# Patient Record
Sex: Female | Born: 1935 | Race: White | Hispanic: No
Health system: Southern US, Community
[De-identification: ages and names within clinical notes are randomized; demographics above are authoritative.]

## PROBLEM LIST (undated history)

## (undated) ENCOUNTER — Emergency Department (HOSPITAL_COMMUNITY): Payer: Self-pay

## (undated) DIAGNOSIS — F0390 Unspecified dementia without behavioral disturbance: Secondary | ICD-10-CM

## (undated) DIAGNOSIS — J9621 Acute and chronic respiratory failure with hypoxia: Secondary | ICD-10-CM

## (undated) DIAGNOSIS — J189 Pneumonia, unspecified organism: Secondary | ICD-10-CM

## (undated) DIAGNOSIS — I5032 Chronic diastolic (congestive) heart failure: Secondary | ICD-10-CM

## (undated) HISTORY — DX: Pneumonia, unspecified organism: J18.9

## (undated) HISTORY — DX: Chronic diastolic (congestive) heart failure: I50.32

## (undated) HISTORY — DX: Unspecified dementia, unspecified severity, without behavioral disturbance, psychotic disturbance, mood disturbance, and anxiety: F03.90

## (undated) HISTORY — DX: Acute and chronic respiratory failure with hypoxia: J96.21

---

## 2020-02-13 ENCOUNTER — Other Ambulatory Visit (HOSPITAL_COMMUNITY): Payer: Self-pay

## 2020-02-13 ENCOUNTER — Inpatient Hospital Stay
Admission: EM | Admit: 2020-02-13 | Discharge: 2020-03-09 | Disposition: E | Payer: Medicare Other | Attending: Internal Medicine | Admitting: Internal Medicine

## 2020-02-13 DIAGNOSIS — I5032 Chronic diastolic (congestive) heart failure: Secondary | ICD-10-CM | POA: Diagnosis present

## 2020-02-13 DIAGNOSIS — K567 Ileus, unspecified: Secondary | ICD-10-CM

## 2020-02-13 DIAGNOSIS — R0603 Acute respiratory distress: Secondary | ICD-10-CM

## 2020-02-13 DIAGNOSIS — J189 Pneumonia, unspecified organism: Secondary | ICD-10-CM | POA: Diagnosis present

## 2020-02-13 DIAGNOSIS — Z0189 Encounter for other specified special examinations: Secondary | ICD-10-CM

## 2020-02-13 DIAGNOSIS — N179 Acute kidney failure, unspecified: Secondary | ICD-10-CM

## 2020-02-13 DIAGNOSIS — F0390 Unspecified dementia without behavioral disturbance: Secondary | ICD-10-CM | POA: Diagnosis present

## 2020-02-13 DIAGNOSIS — J9621 Acute and chronic respiratory failure with hypoxia: Secondary | ICD-10-CM | POA: Diagnosis present

## 2020-02-13 DIAGNOSIS — J90 Pleural effusion, not elsewhere classified: Secondary | ICD-10-CM

## 2020-02-13 DIAGNOSIS — Z4659 Encounter for fitting and adjustment of other gastrointestinal appliance and device: Secondary | ICD-10-CM

## 2020-02-14 DIAGNOSIS — F0390 Unspecified dementia without behavioral disturbance: Secondary | ICD-10-CM | POA: Diagnosis not present

## 2020-02-14 DIAGNOSIS — J9621 Acute and chronic respiratory failure with hypoxia: Secondary | ICD-10-CM | POA: Diagnosis not present

## 2020-02-14 DIAGNOSIS — N179 Acute kidney failure, unspecified: Secondary | ICD-10-CM | POA: Diagnosis not present

## 2020-02-14 DIAGNOSIS — J189 Pneumonia, unspecified organism: Secondary | ICD-10-CM

## 2020-02-14 DIAGNOSIS — I5032 Chronic diastolic (congestive) heart failure: Secondary | ICD-10-CM

## 2020-02-14 LAB — CBC WITH DIFFERENTIAL/PLATELET
Abs Immature Granulocytes: 0.03 10*3/uL (ref 0.00–0.07)
Basophils Absolute: 0 10*3/uL (ref 0.0–0.1)
Basophils Relative: 1 %
Eosinophils Absolute: 0.5 10*3/uL (ref 0.0–0.5)
Eosinophils Relative: 7 %
HCT: 28 % — ABNORMAL LOW (ref 36.0–46.0)
Hemoglobin: 8.5 g/dL — ABNORMAL LOW (ref 12.0–15.0)
Immature Granulocytes: 0 %
Lymphocytes Relative: 24 %
Lymphs Abs: 1.7 10*3/uL (ref 0.7–4.0)
MCH: 31.3 pg (ref 26.0–34.0)
MCHC: 30.4 g/dL (ref 30.0–36.0)
MCV: 102.9 fL — ABNORMAL HIGH (ref 80.0–100.0)
Monocytes Absolute: 0.7 10*3/uL (ref 0.1–1.0)
Monocytes Relative: 10 %
Neutro Abs: 3.9 10*3/uL (ref 1.7–7.7)
Neutrophils Relative %: 58 %
Platelets: 230 10*3/uL (ref 150–400)
RBC: 2.72 MIL/uL — ABNORMAL LOW (ref 3.87–5.11)
RDW: 14.6 % (ref 11.5–15.5)
WBC: 6.8 10*3/uL (ref 4.0–10.5)
nRBC: 0 % (ref 0.0–0.2)

## 2020-02-14 LAB — COMPREHENSIVE METABOLIC PANEL
ALT: 11 U/L (ref 0–44)
AST: 12 U/L — ABNORMAL LOW (ref 15–41)
Albumin: 2.2 g/dL — ABNORMAL LOW (ref 3.5–5.0)
Alkaline Phosphatase: 70 U/L (ref 38–126)
Anion gap: 3 — ABNORMAL LOW (ref 5–15)
BUN: 29 mg/dL — ABNORMAL HIGH (ref 8–23)
CO2: 31 mmol/L (ref 22–32)
Calcium: 8.5 mg/dL — ABNORMAL LOW (ref 8.9–10.3)
Chloride: 105 mmol/L (ref 98–111)
Creatinine, Ser: 1.51 mg/dL — ABNORMAL HIGH (ref 0.44–1.00)
GFR calc Af Amer: 34 mL/min — ABNORMAL LOW (ref >60)
GFR calc non Af Amer: 30 mL/min — ABNORMAL LOW (ref >60)
Glucose, Bld: 195 mg/dL — ABNORMAL HIGH (ref 70–99)
Potassium: 4.7 mmol/L (ref 3.5–5.1)
Sodium: 139 mmol/L (ref 135–145)
Total Bilirubin: 0.3 mg/dL (ref 0.3–1.2)
Total Protein: 5.6 g/dL — ABNORMAL LOW (ref 6.5–8.1)

## 2020-02-14 LAB — URINALYSIS, ROUTINE W REFLEX MICROSCOPIC
Bilirubin Urine: NEGATIVE
Glucose, UA: NEGATIVE mg/dL
Hgb urine dipstick: NEGATIVE
Ketones, ur: NEGATIVE mg/dL
Nitrite: NEGATIVE
Protein, ur: NEGATIVE mg/dL
Specific Gravity, Urine: 1.011 (ref 1.005–1.030)
WBC, UA: >50 WBC/hpf — ABNORMAL HIGH (ref 0–5)
pH: 5 (ref 5.0–8.0)

## 2020-02-14 LAB — TSH: TSH: 5.782 u[IU]/mL — ABNORMAL HIGH (ref 0.350–4.500)

## 2020-02-14 LAB — PHOSPHORUS: Phosphorus: 4.1 mg/dL (ref 2.5–4.6)

## 2020-02-14 LAB — MAGNESIUM: Magnesium: 2 mg/dL (ref 1.7–2.4)

## 2020-02-14 LAB — HEMOGLOBIN A1C
Hgb A1c MFr Bld: 8.5 % — ABNORMAL HIGH (ref 4.8–5.6)
Mean Plasma Glucose: 197.25 mg/dL

## 2020-02-14 LAB — PROTIME-INR
INR: 1.2 (ref 0.8–1.2)
Prothrombin Time: 14.3 seconds (ref 11.4–15.2)

## 2020-02-14 NOTE — Consult Note (Signed)
Pulmonary Critical Care Medicine [facility] [place]  PULMONARY SERVICE  Date of Service: 02/14/2020  PULMONARY CRITICAL CARE CONSULT   Rebecca Cardenas  WPY:[id number]  DOB: Jul 26, 1925   DOA: March 13, 2020  Referring Physician: Carron Curie, MD  HPI: Rebecca Cardenas is a 86 y.o. female seen for follow up of Acute on Chronic Respiratory Failure.  Patient has multiple medical problems including sleep apnea COPD CHF hypertension hyperlipidemia dementia diabetes with vascular disease came into the hospital because of a wound on the left foot.  Patient had work-up including excision and debridement.  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  The patient subsequently developed pneumonia in the hospital and was not able to wean off of oxygen.  Patient came to [place] on high flow oxygen however has had done really low saturations and is going to be requiring BiPAP.  The patient some family wanted aggressive measures and the only limitation was no intubation.  At the time that she seen she does not look good and is having easy desaturations and was nonverbal nonresponsive secondary to her dementia  Review of Systems:  ROS performed and is unremarkable other than noted above.  Past medical history: Sleep apnea COPD CHF Diabetes Neuropathy Dementia Hypertension Hyperlipidemia  Past surgical history Appendectomy Cholecystectomy AKA Hysterectomy  Social history: Never smoker No alcohol or tobacco or drug abuse  Family history: Noncontributory to the present illness  Allergies: Multiple allergies including Cymbalta amitriptyline Lyrica gabapentin codeine ibuprofen morphine penicillin and sulfa    Medications: Reviewed on Rounds  Physical Exam:  Vitals: Temperature is 96.2 pulse 62 respiratory rate 22 blood pressure is 149/71 saturations 96%  Ventilator Settings patient was off the ventilator on high flow oxygen at the time of  admission   General: Comfortable at this time  Eyes: Grossly normal lids, irises & conjunctiva  ENT: grossly tongue is normal  Neck: no obvious mass  Cardiovascular: S1-S2 normal no gallop or rub  Respiratory: Coarse breath sounds with few scattered rhonchi  Abdomen: Soft nontender  Skin: no rash seen on limited exam  Musculoskeletal: not rigid  Psychiatric:unable to assess  Neurologic: no seizure no involuntary movements         Labs on Admission:  Basic Metabolic Panel: No results for input(s): NA, K, CL, CO2, GLUCOSE, BUN, CREATININE, CALCIUM, MG, PHOS in the last 168 hours.  No results for input(s): PHART, PCO2ART, PO2ART, HCO3, O2SAT in the last 168 hours.  Liver Function Tests: No results for input(s): AST, ALT, ALKPHOS, BILITOT, PROT, ALBUMIN in the last 168 hours. No results for input(s): LIPASE, AMYLASE in the last 168 hours. No results for input(s): AMMONIA in the last 168 hours.  CBC: No results for input(s): WBC, NEUTROABS, HGB, HCT, MCV, PLT in the last 168 hours.  Cardiac Enzymes: No results for input(s): CKTOTAL, CKMB, CKMBINDEX, TROPONINI in the last 168 hours.  BNP (last 3 results) No results for input(s): BNP in the last 8760 hours.  ProBNP (last 3 results) No results for input(s): PROBNP in the last 8760 hours.   Radiological Exams on Admission: DG Abd 1 View  Result Date: 2020-03-13 CLINICAL DATA:  Ileus. EXAM: ABDOMEN - 1 VIEW COMPARISON:  None. FINDINGS: 1757 hours. Lucency over the mid abdomen likely gastric bubble although focal colonic distension not excluded. No definite small bowel distension. High density within left colonic diverticuli likely related to inspissated stool. Patient is status post lower lumbar fusion. Bones are diffusely demineralized. IMPRESSION: Gas  over the mid stomach felt to be most likely related to gas in the stomach. Focal colonic distension not entirely excluded. No gaseous small bowel dilatation. Electronically  Signed   By: Kennith Center M.D.   On: 2020/03/14 18:25   DG CHEST PORT 1 VIEW  Result Date: 03/14/20 CLINICAL DATA:  Pneumonia. EXAM: PORTABLE CHEST 1 VIEW COMPARISON:  None. FINDINGS: 1753 hours. Patient is rotated to the left. Diffuse interstitial and basilar airspace disease noted. More confluent collapse/consolidative opacity noted retrocardiac left base. Probable small bilateral pleural effusions. Bones are diffusely demineralized. Thoracic spinal stimulator device noted. Telemetry leads overlie the chest. IMPRESSION: Rotated film with cardiomegaly and bibasilar collapse/consolidation with small bilateral pleural effusions. Electronically Signed   By: Kennith Center M.D.   On: March 14, 2020 18:23    Assessment/Plan Active Problems:   Acute on chronic respiratory failure with hypoxia (HCC)   Healthcare-associated pneumonia   Chronic congestive heart failure with left ventricular diastolic dysfunction (HCC)   Dementia (HCC)   1. Acute on chronic respiratory failure hypoxia patient currently is on high flow oxygen requiring titration.  Saturations are acceptable right now spoke with respiratory therapy try to wean FiO2 down as tolerated.  The patient did have a chest x-ray done which showed some basilar consolidations needs ongoing pulmonary toilet 2. Healthcare associated pneumonia patient has been on antibiotics with on Rocephin as well as azithromycin.  The most recent chest x-ray shows bibasilar infiltrate still present.  We will continue to follow-up.  Likely is explanation for her her significant hypoxia. 3. Congestive heart failure patient had acute renal decompensation with being placed on diuretics this is being monitored right now plan is going to be to continue with supportive care. 4. Dementia apparently severe disability.  Overall prognosis is quite poor  I have personally seen and evaluated the patient, evaluated laboratory and imaging results, formulated the assessment and plan and  placed orders. The Patient requires high complexity decision making with multiple systems involvement.  Case was discussed on Rounds with the Respiratory Therapy Director and the Respiratory staff Time Spent  Yevonne Pax, MD [facility] Pulmonary Critical Care Medicine Sleep Medicine

## 2020-02-15 ENCOUNTER — Other Ambulatory Visit (HOSPITAL_COMMUNITY): Payer: Self-pay

## 2020-02-15 DIAGNOSIS — I5032 Chronic diastolic (congestive) heart failure: Secondary | ICD-10-CM | POA: Diagnosis not present

## 2020-02-15 DIAGNOSIS — F0390 Unspecified dementia without behavioral disturbance: Secondary | ICD-10-CM | POA: Diagnosis not present

## 2020-02-15 DIAGNOSIS — J9621 Acute and chronic respiratory failure with hypoxia: Secondary | ICD-10-CM | POA: Diagnosis not present

## 2020-02-15 DIAGNOSIS — N179 Acute kidney failure, unspecified: Secondary | ICD-10-CM | POA: Diagnosis not present

## 2020-02-15 LAB — URINE CULTURE: Culture: NO GROWTH

## 2020-02-15 LAB — CBC
HCT: 29.4 % — ABNORMAL LOW (ref 36.0–46.0)
Hemoglobin: 8.8 g/dL — ABNORMAL LOW (ref 12.0–15.0)
MCH: 30.9 pg (ref 26.0–34.0)
MCHC: 29.9 g/dL — ABNORMAL LOW (ref 30.0–36.0)
MCV: 103.2 fL — ABNORMAL HIGH (ref 80.0–100.0)
Platelets: 231 10*3/uL (ref 150–400)
RBC: 2.85 MIL/uL — ABNORMAL LOW (ref 3.87–5.11)
RDW: 14.4 % (ref 11.5–15.5)
WBC: 7.8 10*3/uL (ref 4.0–10.5)
nRBC: 0 % (ref 0.0–0.2)

## 2020-02-15 LAB — BASIC METABOLIC PANEL
Anion gap: 8 (ref 5–15)
BUN: 21 mg/dL (ref 8–23)
CO2: 27 mmol/L (ref 22–32)
Calcium: 8.5 mg/dL — ABNORMAL LOW (ref 8.9–10.3)
Chloride: 107 mmol/L (ref 98–111)
Creatinine, Ser: 1.3 mg/dL — ABNORMAL HIGH (ref 0.44–1.00)
GFR calc Af Amer: 41 mL/min — ABNORMAL LOW (ref >60)
GFR calc non Af Amer: 36 mL/min — ABNORMAL LOW (ref >60)
Glucose, Bld: 108 mg/dL — ABNORMAL HIGH (ref 70–99)
Potassium: 4.6 mmol/L (ref 3.5–5.1)
Sodium: 142 mmol/L (ref 135–145)

## 2020-02-15 LAB — MAGNESIUM: Magnesium: 1.8 mg/dL (ref 1.7–2.4)

## 2020-02-15 LAB — PHOSPHORUS: Phosphorus: 4.1 mg/dL (ref 2.5–4.6)

## 2020-02-15 MED ORDER — LIDOCAINE VISCOUS HCL 2 % MT SOLN
OROMUCOSAL | Status: AC
  Filled 2020-02-15: qty 15

## 2020-02-15 NOTE — Progress Notes (Signed)
Pulmonary Critical Care Medicine [facility] [place]   PULMONARY CRITICAL CARE SERVICE  PROGRESS NOTE  Date of Service: 02/15/2020  Achsah Cardenas  UXN:[id number]  DOB: [birth year]   DOA: 02-22-2020  Referring Physician: Carron Curie, MD  HPI: Rebecca Cardenas is a 86 y.o. female seen for follow up of Acute on Chronic Respiratory Failure. Patient was still on high flow nasal cannula. She has been made DNR however still requiring significantly increased FiO2 levels.  Medications: Reviewed on Rounds  Physical Exam:  Vitals: Temperature was 97.7 pulse 83 respiratory 25 blood pressure is 116/75 saturations 93%  Ventilator Settings off the ventilator on high flow nasal cannula  . General: Comfortable at this time . Eyes: Grossly normal lids, irises & conjunctiva . ENT: grossly tongue is normal . Neck: no obvious mass . Cardiovascular: S1 S2 normal no gallop . Respiratory: Scattered rhonchi expansion is equal at this time . Abdomen: soft . Skin: no rash seen on limited exam . Musculoskeletal: not rigid . Psychiatric:unable to assess . Neurologic: no seizure no involuntary movements         Lab Data:   Basic Metabolic Panel: Recent Labs  Lab 02/14/20 1014 02/15/20 0820  NA 139 142  K 4.7 4.6  CL 105 107  CO2 31 27  GLUCOSE 195* 108*  BUN 29* 21  CREATININE 1.51* 1.30*  CALCIUM 8.5* 8.5*  MG 2.0 1.8  PHOS 4.1 4.1    ABG: No results for input(s): PHART, PCO2ART, PO2ART, HCO3, O2SAT in the last 168 hours.  Liver Function Tests: Recent Labs  Lab 02/14/20 1014  AST 12*  ALT 11  ALKPHOS 70  BILITOT 0.3  PROT 5.6*  ALBUMIN 2.2*   No results for input(s): LIPASE, AMYLASE in the last 168 hours. No results for input(s): AMMONIA in the last 168 hours.  CBC: Recent Labs  Lab 02/14/20 1014 02/15/20 0820  WBC 6.8 7.8  NEUTROABS 3.9  --   HGB 8.5* 8.8*  HCT 28.0* 29.4*  MCV 102.9* 103.2*  PLT 230 231    Cardiac Enzymes: No results for input(s): CKTOTAL,  CKMB, CKMBINDEX, TROPONINI in the last 168 hours.  BNP (last 3 results) No results for input(s): BNP in the last 8760 hours.  ProBNP (last 3 results) No results for input(s): PROBNP in the last 8760 hours.  Radiological Exams: DG Abd 1 View  Result Date: 02/15/2020 CLINICAL DATA:  NG tube placement EXAM: ABDOMEN - 1 VIEW COMPARISON:  22-Feb-2020 FINDINGS: The enteric tube extends below the left hemidiaphragm and terminates in the region of the distal gastric body. Bilateral pleural effusions are noted. There is cardiomegaly. The visualized bowel gas pattern is unremarkable. IMPRESSION: Enteric tube terminates in the region of the distal gastric body. Electronically Signed   By: Katherine Mantle M.D.   On: 02/15/2020 16:26    Assessment/Plan Active Problems:   Acute on chronic respiratory failure with hypoxia (HCC)   Healthcare-associated pneumonia   Chronic congestive heart failure with left ventricular diastolic dysfunction (HCC)   Dementia (HCC)   1. Acute on chronic respiratory failure with hypoxia not able to wean down on the oxygen levels still on high flow oxygen. Patient in addition has been also requiring a facemask. We will continue with oxygen therapy try to titrate down as hopefully is the patient's condition improves 2. Healthcare associated pneumonia really showing no improvement.  Still with significant infiltrates and severe hypoxia 3. Chronic congestive heart failure we will monitoring the fluid status closely.  4. Dementia no change no improvement prognosis poor   I have personally seen and evaluated the patient, evaluated laboratory and imaging results, formulated the assessment and plan and placed orders. The Patient requires high complexity decision making with multiple systems involvement.  Rounds were done with the Respiratory Therapy Director and Staff therapists and discussed with nursing staff also.  Yevonne Pax, MD [facility] Pulmonary Critical Care  Medicine Sleep Medicine

## 2020-02-16 DIAGNOSIS — N179 Acute kidney failure, unspecified: Secondary | ICD-10-CM | POA: Diagnosis not present

## 2020-02-16 DIAGNOSIS — I5032 Chronic diastolic (congestive) heart failure: Secondary | ICD-10-CM | POA: Diagnosis not present

## 2020-02-16 DIAGNOSIS — F0390 Unspecified dementia without behavioral disturbance: Secondary | ICD-10-CM | POA: Diagnosis not present

## 2020-02-16 DIAGNOSIS — J9621 Acute and chronic respiratory failure with hypoxia: Secondary | ICD-10-CM | POA: Diagnosis not present

## 2020-02-16 NOTE — Progress Notes (Addendum)
Pulmonary Critical Care Medicine [facility] [place]   PULMONARY CRITICAL CARE SERVICE  PROGRESS NOTE  Date of Service: 02/16/2020  Rebecca Cardenas  XUX:[id number]  DOB: 05-29-1925   DOA: 25-Feb-2020  Referring Physician: Carron Curie, MD  HPI: Rebecca Cardenas is a 86 y.o. female seen for follow up of Acute on Chronic Respiratory Failure.  Patient continues on heated high flow nasal cannula currently on 40 L 100% FiO2 satting in the low 90s.  Medications: Reviewed on Rounds  Physical Exam:  Vitals: Pulse 97 respirations 28 BP 152/87 O2 sat 94% temp 98.2  Ventilator Settings heated high flow 40 L 100% FiO2  . General: Comfortable at this time . Eyes: Grossly normal lids, irises & conjunctiva . ENT: grossly tongue is normal . Neck: no obvious mass . Cardiovascular: S1 S2 normal no gallop . Respiratory: No rales or rhonchi noted . Abdomen: soft . Skin: no rash seen on limited exam . Musculoskeletal: not rigid . Psychiatric:unable to assess . Neurologic: no seizure no involuntary movements         Lab Data:   Basic Metabolic Panel: Recent Labs  Lab 02/14/20 1014 02/15/20 0820  NA 139 142  K 4.7 4.6  CL 105 107  CO2 31 27  GLUCOSE 195* 108*  BUN 29* 21  CREATININE 1.51* 1.30*  CALCIUM 8.5* 8.5*  MG 2.0 1.8  PHOS 4.1 4.1    ABG: No results for input(s): PHART, PCO2ART, PO2ART, HCO3, O2SAT in the last 168 hours.  Liver Function Tests: Recent Labs  Lab 02/14/20 1014  AST 12*  ALT 11  ALKPHOS 70  BILITOT 0.3  PROT 5.6*  ALBUMIN 2.2*   No results for input(s): LIPASE, AMYLASE in the last 168 hours. No results for input(s): AMMONIA in the last 168 hours.  CBC: Recent Labs  Lab 02/14/20 1014 02/15/20 0820  WBC 6.8 7.8  NEUTROABS 3.9  --   HGB 8.5* 8.8*  HCT 28.0* 29.4*  MCV 102.9* 103.2*  PLT 230 231    Cardiac Enzymes: No results for input(s): CKTOTAL, CKMB, CKMBINDEX, TROPONINI in the last 168 hours.  BNP (last 3 results) No results for  input(s): BNP in the last 8760 hours.  ProBNP (last 3 results) No results for input(s): PROBNP in the last 8760 hours.  Radiological Exams: DG Abd 1 View  Result Date: 02/15/2020 CLINICAL DATA:  NG tube placement EXAM: ABDOMEN - 1 VIEW COMPARISON:  February 25, 2020 FINDINGS: The enteric tube extends below the left hemidiaphragm and terminates in the region of the distal gastric body. Bilateral pleural effusions are noted. There is cardiomegaly. The visualized bowel gas pattern is unremarkable. IMPRESSION: Enteric tube terminates in the region of the distal gastric body. Electronically Signed   By: Katherine Mantle M.D.   On: 02/15/2020 16:26    Assessment/Plan Active Problems:   Acute on chronic respiratory failure with hypoxia (HCC)   Healthcare-associated pneumonia   Chronic congestive heart failure with left ventricular diastolic dysfunction (HCC)   Dementia (HCC)   1. Acute on chronic respiratory with hypoxia continue to attempt titrating oxygen prognosis remains poor this time. 2. Healthcare associated pneumonia treated no improvement we will monitor 3. Chronic congestive heart failure at baseline we will continue with supportive care 4. Dementia no change   I have personally seen and evaluated the patient, evaluated laboratory and imaging results, formulated the assessment and plan and placed orders. The Patient requires high complexity decision making with multiple systems involvement.  Rounds were done  with the Respiratory Therapy Director and Staff therapists and discussed with nursing staff also.  Allyne Gee, MD [facility] Pulmonary Critical Care Medicine Sleep Medicine

## 2020-02-17 ENCOUNTER — Other Ambulatory Visit (HOSPITAL_COMMUNITY): Payer: Self-pay

## 2020-02-17 DIAGNOSIS — N179 Acute kidney failure, unspecified: Secondary | ICD-10-CM | POA: Diagnosis not present

## 2020-02-17 DIAGNOSIS — F0390 Unspecified dementia without behavioral disturbance: Secondary | ICD-10-CM | POA: Diagnosis not present

## 2020-02-17 DIAGNOSIS — I5032 Chronic diastolic (congestive) heart failure: Secondary | ICD-10-CM | POA: Diagnosis not present

## 2020-02-17 DIAGNOSIS — J9621 Acute and chronic respiratory failure with hypoxia: Secondary | ICD-10-CM | POA: Diagnosis not present

## 2020-02-17 LAB — RENAL FUNCTION PANEL
Albumin: 2.2 g/dL — ABNORMAL LOW (ref 3.5–5.0)
Anion gap: 9 (ref 5–15)
BUN: 31 mg/dL — ABNORMAL HIGH (ref 8–23)
CO2: 25 mmol/L (ref 22–32)
Calcium: 8.7 mg/dL — ABNORMAL LOW (ref 8.9–10.3)
Chloride: 105 mmol/L (ref 98–111)
Creatinine, Ser: 1.76 mg/dL — ABNORMAL HIGH (ref 0.44–1.00)
GFR calc Af Amer: 29 mL/min — ABNORMAL LOW (ref >60)
GFR calc non Af Amer: 25 mL/min — ABNORMAL LOW (ref >60)
Glucose, Bld: 227 mg/dL — ABNORMAL HIGH (ref 70–99)
Phosphorus: 4.5 mg/dL (ref 2.5–4.6)
Potassium: 5 mmol/L (ref 3.5–5.1)
Sodium: 139 mmol/L (ref 135–145)

## 2020-02-17 LAB — CBC
HCT: 29.5 % — ABNORMAL LOW (ref 36.0–46.0)
Hemoglobin: 8.9 g/dL — ABNORMAL LOW (ref 12.0–15.0)
MCH: 31.2 pg (ref 26.0–34.0)
MCHC: 30.2 g/dL (ref 30.0–36.0)
MCV: 103.5 fL — ABNORMAL HIGH (ref 80.0–100.0)
Platelets: 210 10*3/uL (ref 150–400)
RBC: 2.85 MIL/uL — ABNORMAL LOW (ref 3.87–5.11)
RDW: 15.2 % (ref 11.5–15.5)
WBC: 9 10*3/uL (ref 4.0–10.5)
nRBC: 0 % (ref 0.0–0.2)

## 2020-02-17 LAB — MAGNESIUM: Magnesium: 2.1 mg/dL (ref 1.7–2.4)

## 2020-02-17 NOTE — Progress Notes (Addendum)
Pulmonary Critical Care Medicine [facility] [place]   PULMONARY CRITICAL CARE SERVICE  PROGRESS NOTE  Date of Service: 02/17/2020  Rebecca Cardenas  YPP:[id number]  DOB: 1924/12/08   DOA: 02/23/2020  Referring Physician: Carron Curie, MD  HPI: Rebecca Cardenas is a 87 y.o. female seen for follow up of Acute on Chronic Respiratory Failure.  She remains on high flow oxygen had been on BiPAP briefly right now is on 40 L flow rate  Medications: Reviewed on Rounds  Physical Exam:  Vitals: Temperature is 97.5 pulse 101 respiratory 35 blood pressure is 139/60 saturations 93%  Ventilator Settings on high flow  . General: Comfortable at this time . Eyes: Grossly normal lids, irises & conjunctiva . ENT: grossly tongue is normal . Neck: no obvious mass . Cardiovascular: S1 S2 normal no gallop . Respiratory: No rhonchi coarse breath sounds . Abdomen: soft . Skin: no rash seen on limited exam . Musculoskeletal: not rigid . Psychiatric:unable to assess . Neurologic: no seizure no involuntary movements         Lab Data:   Basic Metabolic Panel: Recent Labs  Lab 02/14/20 1014 02/15/20 0820 02/17/20 0503  NA 139 142 139  K 4.7 4.6 5.0  CL 105 107 105  CO2 31 27 25   GLUCOSE 195* 108* 227*  BUN 29* 21 31*  CREATININE 1.51* 1.30* 1.76*  CALCIUM 8.5* 8.5* 8.7*  MG 2.0 1.8 2.1  PHOS 4.1 4.1 4.5    ABG: No results for input(s): PHART, PCO2ART, PO2ART, HCO3, O2SAT in the last 168 hours.  Liver Function Tests: Recent Labs  Lab 02/14/20 1014 02/17/20 0503  AST 12*  --   ALT 11  --   ALKPHOS 70  --   BILITOT 0.3  --   PROT 5.6*  --   ALBUMIN 2.2* 2.2*   No results for input(s): LIPASE, AMYLASE in the last 168 hours. No results for input(s): AMMONIA in the last 168 hours.  CBC: Recent Labs  Lab 02/14/20 1014 02/15/20 0820 02/17/20 0503  WBC 6.8 7.8 9.0  NEUTROABS 3.9  --   --   HGB 8.5* 8.8* 8.9*  HCT 28.0* 29.4* 29.5*  MCV 102.9* 103.2* 103.5*  PLT 230 231  210    Cardiac Enzymes: No results for input(s): CKTOTAL, CKMB, CKMBINDEX, TROPONINI in the last 168 hours.  BNP (last 3 results) No results for input(s): BNP in the last 8760 hours.  ProBNP (last 3 results) No results for input(s): PROBNP in the last 8760 hours.  Radiological Exams: DG Abd 1 View  Result Date: 02/15/2020 CLINICAL DATA:  NG tube placement EXAM: ABDOMEN - 1 VIEW COMPARISON:  February 13, 2020 FINDINGS: The enteric tube extends below the left hemidiaphragm and terminates in the region of the distal gastric body. Bilateral pleural effusions are noted. There is cardiomegaly. The visualized bowel gas pattern is unremarkable. IMPRESSION: Enteric tube terminates in the region of the distal gastric body. Electronically Signed   By: February 15, 2020 M.D.   On: 02/15/2020 16:26   DG CHEST PORT 1 VIEW  Result Date: 02/17/2020 CLINICAL DATA:  Pneumonia, pneumothorax. EXAM: PORTABLE CHEST 1 VIEW COMPARISON:  Chest x-ray dated 02/27/2020. FINDINGS: Stable cardiomegaly. Persistent bibasilar opacities. No pneumothorax is seen. Enteric tube passes below the diaphragm. IMPRESSION: 1. Stable bibasilar opacities, pneumonia versus atelectasis. Suspect additional small bilateral pleural effusions. 2. Stable cardiomegaly. Electronically Signed   By: 04/15/2020 M.D.   On: 02/17/2020 08:21    Assessment/Plan Active Problems:  Acute on chronic respiratory failure with hypoxia (HCC)   Healthcare-associated pneumonia   Chronic congestive heart failure with left ventricular diastolic dysfunction (HCC)   Dementia (HCC)   1. Acute on chronic respiratory failure hypoxia try to titrate oxygen down as tolerated continue with speech management pulmonary toilet. 2. Healthcare associated pneumonia at baseline we will continue to monitor. 3. Chronic heart failure no change continue with supportive care.   4. Dementia unchanged severe disease   I have personally seen and evaluated the patient,  evaluated laboratory and imaging results, formulated the assessment and plan and placed orders. The Patient requires high complexity decision making with multiple systems involvement.  Rounds were done with the Respiratory Therapy Director and Staff therapists and discussed with nursing staff also.  Yevonne Pax, MD [facility] Pulmonary Critical Care Medicine Sleep Medicine

## 2020-02-18 ENCOUNTER — Other Ambulatory Visit (HOSPITAL_COMMUNITY): Payer: Self-pay

## 2020-02-18 DIAGNOSIS — I5032 Chronic diastolic (congestive) heart failure: Secondary | ICD-10-CM | POA: Diagnosis not present

## 2020-02-18 DIAGNOSIS — J9621 Acute and chronic respiratory failure with hypoxia: Secondary | ICD-10-CM | POA: Diagnosis not present

## 2020-02-18 DIAGNOSIS — N179 Acute kidney failure, unspecified: Secondary | ICD-10-CM | POA: Diagnosis not present

## 2020-02-18 DIAGNOSIS — F0390 Unspecified dementia without behavioral disturbance: Secondary | ICD-10-CM | POA: Diagnosis not present

## 2020-02-18 LAB — CULTURE, RESPIRATORY W GRAM STAIN

## 2020-02-18 LAB — BLOOD GAS, ARTERIAL
Acid-Base Excess: 1.5 mmol/L (ref 0.0–2.0)
Bicarbonate: 25.9 mmol/L (ref 20.0–28.0)
FIO2: 100
O2 Saturation: 87.2 %
Patient temperature: 36.1
pCO2 arterial: 43.4 mmHg (ref 32.0–48.0)
pH, Arterial: 7.393 (ref 7.350–7.450)
pO2, Arterial: 57.8 mmHg — ABNORMAL LOW (ref 83.0–108.0)

## 2020-02-18 LAB — RENAL FUNCTION PANEL
Albumin: 2.3 g/dL — ABNORMAL LOW (ref 3.5–5.0)
Anion gap: 9 (ref 5–15)
BUN: 40 mg/dL — ABNORMAL HIGH (ref 8–23)
CO2: 24 mmol/L (ref 22–32)
Calcium: 8.6 mg/dL — ABNORMAL LOW (ref 8.9–10.3)
Chloride: 105 mmol/L (ref 98–111)
Creatinine, Ser: 2.02 mg/dL — ABNORMAL HIGH (ref 0.44–1.00)
GFR calc Af Amer: 24 mL/min — ABNORMAL LOW (ref >60)
GFR calc non Af Amer: 21 mL/min — ABNORMAL LOW (ref >60)
Glucose, Bld: 229 mg/dL — ABNORMAL HIGH (ref 70–99)
Phosphorus: 4.4 mg/dL (ref 2.5–4.6)
Potassium: 5.9 mmol/L — ABNORMAL HIGH (ref 3.5–5.1)
Sodium: 138 mmol/L (ref 135–145)

## 2020-02-18 LAB — CBC
HCT: 29.9 % — ABNORMAL LOW (ref 36.0–46.0)
Hemoglobin: 9.3 g/dL — ABNORMAL LOW (ref 12.0–15.0)
MCH: 32.2 pg (ref 26.0–34.0)
MCHC: 31.1 g/dL (ref 30.0–36.0)
MCV: 103.5 fL — ABNORMAL HIGH (ref 80.0–100.0)
Platelets: 185 10*3/uL (ref 150–400)
RBC: 2.89 MIL/uL — ABNORMAL LOW (ref 3.87–5.11)
RDW: 15.7 % — ABNORMAL HIGH (ref 11.5–15.5)
WBC: 11.3 10*3/uL — ABNORMAL HIGH (ref 4.0–10.5)
nRBC: 0 % (ref 0.0–0.2)

## 2020-02-18 LAB — MAGNESIUM: Magnesium: 2.1 mg/dL (ref 1.7–2.4)

## 2020-02-18 LAB — POTASSIUM: Potassium: 5.4 mmol/L — ABNORMAL HIGH (ref 3.5–5.1)

## 2020-02-18 NOTE — Progress Notes (Signed)
Pulmonary Critical Care Medicine [facility] [place]   PULMONARY CRITICAL CARE SERVICE  PROGRESS NOTE  Date of Service: 02/18/2020  Rebecca Cardenas  TMH:[id number]  DOB: 08/17/1924   DOA: 03/02/2020  Referring Physician: Carron Curie, MD  HPI: Rebecca Cardenas is a 86 y.o. female seen for follow up of Acute on Chronic Respiratory Failure.  She continues to do poorly has been on 100% FiO2.  She remains DNR.  She however has been placed on BiPAP now 18/8.  Medications: Reviewed on Rounds  Physical Exam:  Vitals: Temperature is 98.1 pulse 101 respiratory 19 blood pressure is 135/85 saturations 94%  Ventilator Settings on BiPAP inspiratory pressure 18 expiratory pressure 8 FiO2 100%  . General: Comfortable at this time . Eyes: Grossly normal lids, irises & conjunctiva . ENT: grossly tongue is normal . Neck: no obvious mass . Cardiovascular: S1 S2 normal no gallop . Respiratory: Scattered rhonchi coarse breath sounds are noted. . Abdomen: soft . Skin: no rash seen on limited exam . Musculoskeletal: not rigid . Psychiatric:unable to assess . Neurologic: no seizure no involuntary movements         Lab Data:   Basic Metabolic Panel: Recent Labs  Lab 02/14/20 1014 02/15/20 0820 02/17/20 0503 02/18/20 0817  NA 139 142 139 138  K 4.7 4.6 5.0 5.9*  CL 105 107 105 105  CO2 31 27 25 24   GLUCOSE 195* 108* 227* 229*  BUN 29* 21 31* 40*  CREATININE 1.51* 1.30* 1.76* 2.02*  CALCIUM 8.5* 8.5* 8.7* 8.6*  MG 2.0 1.8 2.1 2.1  PHOS 4.1 4.1 4.5 4.4    ABG: Recent Labs  Lab 02/18/20 1335  PHART 7.393  PCO2ART 43.4  PO2ART 57.8*  HCO3 25.9  O2SAT 87.2    Liver Function Tests: Recent Labs  Lab 02/14/20 1014 02/17/20 0503 02/18/20 0817  AST 12*  --   --   ALT 11  --   --   ALKPHOS 70  --   --   BILITOT 0.3  --   --   PROT 5.6*  --   --   ALBUMIN 2.2* 2.2* 2.3*   No results for input(s): LIPASE, AMYLASE in the last 168 hours. No results for input(s): AMMONIA in  the last 168 hours.  CBC: Recent Labs  Lab 02/14/20 1014 02/15/20 0820 02/17/20 0503 02/18/20 0817  WBC 6.8 7.8 9.0 11.3*  NEUTROABS 3.9  --   --   --   HGB 8.5* 8.8* 8.9* 9.3*  HCT 28.0* 29.4* 29.5* 29.9*  MCV 102.9* 103.2* 103.5* 103.5*  PLT 230 231 210 185    Cardiac Enzymes: No results for input(s): CKTOTAL, CKMB, CKMBINDEX, TROPONINI in the last 168 hours.  BNP (last 3 results) No results for input(s): BNP in the last 8760 hours.  ProBNP (last 3 results) No results for input(s): PROBNP in the last 8760 hours.  Radiological Exams: DG Chest Port 1 View  Result Date: 02/18/2020 CLINICAL DATA:  Respiratory distress EXAM: PORTABLE CHEST 1 VIEW COMPARISON:  02/17/2020 FINDINGS: Two frontal views of the chest demonstrate stable enteric catheter and spinal stimulator. Cardiac silhouette is enlarged. Persistent bilateral veiling opacities unchanged. No pneumothorax. No acute bony abnormalities. IMPRESSION: 1. Stable bibasilar consolidation and effusions. 2. Stable cardiomegaly. Electronically Signed   By: 04/19/2020 M.D.   On: 02/18/2020 16:15   DG CHEST PORT 1 VIEW  Result Date: 02/17/2020 CLINICAL DATA:  Pneumonia, pneumothorax. EXAM: PORTABLE CHEST 1 VIEW COMPARISON:  Chest x-ray dated 02/22/2020. FINDINGS:  Stable cardiomegaly. Persistent bibasilar opacities. No pneumothorax is seen. Enteric tube passes below the diaphragm. IMPRESSION: 1. Stable bibasilar opacities, pneumonia versus atelectasis. Suspect additional small bilateral pleural effusions. 2. Stable cardiomegaly. Electronically Signed   By: Bary Richard M.D.   On: 02/17/2020 08:21    Assessment/Plan Active Problems:   Acute on chronic respiratory failure with hypoxia (HCC)   Healthcare-associated pneumonia   Chronic congestive heart failure with left ventricular diastolic dysfunction (HCC)   Dementia (HCC)   1. Acute on chronic respiratory failure with hypoxia patient continues to show decline in status now is  on BiPAP and full support.  Plan is going to be to continue with BiPAP for now.  With patient's underlying medical issues and age goals of care needs to be seriously discussed with the family. 2. Healthcare associated pneumonia treated with antibiotics.  Says stable opacities. 3. Chronic congestive heart failure no change monitoring fluid status 4. Dementia severe advanced disease   I have personally seen and evaluated the patient, evaluated laboratory and imaging results, formulated the assessment and plan and placed orders. The Patient requires high complexity decision making with multiple systems involvement.  Rounds were done with the Respiratory Therapy Director and Staff therapists and discussed with nursing staff also.  Yevonne Pax, MD [facility] Pulmonary Critical Care Medicine Sleep Medicine

## 2020-02-19 ENCOUNTER — Other Ambulatory Visit (HOSPITAL_COMMUNITY): Payer: Self-pay

## 2020-02-19 DIAGNOSIS — N179 Acute kidney failure, unspecified: Secondary | ICD-10-CM | POA: Diagnosis not present

## 2020-02-19 DIAGNOSIS — I5032 Chronic diastolic (congestive) heart failure: Secondary | ICD-10-CM | POA: Diagnosis not present

## 2020-02-19 DIAGNOSIS — F0390 Unspecified dementia without behavioral disturbance: Secondary | ICD-10-CM | POA: Diagnosis not present

## 2020-02-19 DIAGNOSIS — J9621 Acute and chronic respiratory failure with hypoxia: Secondary | ICD-10-CM | POA: Diagnosis not present

## 2020-02-19 LAB — RENAL FUNCTION PANEL
Albumin: 2.2 g/dL — ABNORMAL LOW (ref 3.5–5.0)
Anion gap: 10 (ref 5–15)
BUN: 43 mg/dL — ABNORMAL HIGH (ref 8–23)
CO2: 27 mmol/L (ref 22–32)
Calcium: 8.6 mg/dL — ABNORMAL LOW (ref 8.9–10.3)
Chloride: 104 mmol/L (ref 98–111)
Creatinine, Ser: 1.97 mg/dL — ABNORMAL HIGH (ref 0.44–1.00)
GFR calc Af Amer: 25 mL/min — ABNORMAL LOW (ref >60)
GFR calc non Af Amer: 22 mL/min — ABNORMAL LOW (ref >60)
Glucose, Bld: 164 mg/dL — ABNORMAL HIGH (ref 70–99)
Phosphorus: 4.9 mg/dL — ABNORMAL HIGH (ref 2.5–4.6)
Potassium: 4.8 mmol/L (ref 3.5–5.1)
Sodium: 141 mmol/L (ref 135–145)

## 2020-02-19 NOTE — Progress Notes (Signed)
Pulmonary Critical Care Medicine [facility] [place]   PULMONARY CRITICAL CARE SERVICE  PROGRESS NOTE  Date of Service: 02/19/2020  Rebecca Cardenas  JHE:[id number]  DOB: [birth year]   DOA: 02/26/2020  Referring Physician: Carron Curie, MD  HPI: Rebecca Cardenas is a 86 y.o. female seen for follow up of Acute on Chronic Respiratory Failure.  She remains on BiPAP today at day 2 she is currently on a pressure of 18/8.  Once again as discussed she is DNR and goals of care needs to be discussed with the family.  Medications: Reviewed on Rounds  Physical Exam:  Vitals: Temperature 96.4 pulse 82 respiratory 14 blood pressure is 127/77 saturations 92%  Ventilator Settings on BiPAP 18/8 FiO2 was 100%  . General: Comfortable at this time . Eyes: Grossly normal lids, irises & conjunctiva . ENT: grossly tongue is normal . Neck: no obvious mass . Cardiovascular: S1 S2 normal no gallop . Respiratory: No rhonchi coarse breath sounds are noted at this time . Abdomen: soft . Skin: no rash seen on limited exam . Musculoskeletal: not rigid . Psychiatric:unable to assess . Neurologic: no seizure no involuntary movements         Lab Data:   Basic Metabolic Panel: Recent Labs  Lab 02/14/20 1014 02/14/20 1014 02/15/20 0820 02/17/20 0503 02/18/20 0817 02/18/20 2203 02/19/20 0603  NA 139  --  142 139 138  --  141  K 4.7   < > 4.6 5.0 5.9* 5.4* 4.8  CL 105  --  107 105 105  --  104  CO2 31  --  27 25 24   --  27  GLUCOSE 195*  --  108* 227* 229*  --  164*  BUN 29*  --  21 31* 40*  --  43*  CREATININE 1.51*  --  1.30* 1.76* 2.02*  --  1.97*  CALCIUM 8.5*  --  8.5* 8.7* 8.6*  --  8.6*  MG 2.0  --  1.8 2.1 2.1  --   --   PHOS 4.1  --  4.1 4.5 4.4  --  4.9*   < > = values in this interval not displayed.    ABG: Recent Labs  Lab 02/18/20 1335  PHART 7.393  PCO2ART 43.4  PO2ART 57.8*  HCO3 25.9  O2SAT 87.2    Liver Function Tests: Recent Labs  Lab 02/14/20 1014  02/17/20 0503 02/18/20 0817 02/19/20 0603  AST 12*  --   --   --   ALT 11  --   --   --   ALKPHOS 70  --   --   --   BILITOT 0.3  --   --   --   PROT 5.6*  --   --   --   ALBUMIN 2.2* 2.2* 2.3* 2.2*   No results for input(s): LIPASE, AMYLASE in the last 168 hours. No results for input(s): AMMONIA in the last 168 hours.  CBC: Recent Labs  Lab 02/14/20 1014 02/15/20 0820 02/17/20 0503 02/18/20 0817  WBC 6.8 7.8 9.0 11.3*  NEUTROABS 3.9  --   --   --   HGB 8.5* 8.8* 8.9* 9.3*  HCT 28.0* 29.4* 29.5* 29.9*  MCV 102.9* 103.2* 103.5* 103.5*  PLT 230 231 210 185    Cardiac Enzymes: No results for input(s): CKTOTAL, CKMB, CKMBINDEX, TROPONINI in the last 168 hours.  BNP (last 3 results) No results for input(s): BNP in the last 8760 hours.  ProBNP (last 3 results) No  results for input(s): PROBNP in the last 8760 hours.  Radiological Exams: DG Chest Port 1 View  Result Date: 02/18/2020 CLINICAL DATA:  Respiratory distress EXAM: PORTABLE CHEST 1 VIEW COMPARISON:  02/17/2020 FINDINGS: Two frontal views of the chest demonstrate stable enteric catheter and spinal stimulator. Cardiac silhouette is enlarged. Persistent bilateral veiling opacities unchanged. No pneumothorax. No acute bony abnormalities. IMPRESSION: 1. Stable bibasilar consolidation and effusions. 2. Stable cardiomegaly. Electronically Signed   By: Sharlet Salina M.D.   On: 02/18/2020 16:15    Assessment/Plan Active Problems:   Acute on chronic respiratory failure with hypoxia (HCC)   Healthcare-associated pneumonia   Chronic congestive heart failure with left ventricular diastolic dysfunction (HCC)   Dementia (HCC)   1. Acute on chronic respiratory failure with hypoxia plan is to continue with the BiPAP 18/8 as already noted patient's prognosis is quite poor.  Need to have a goals of care conversation with the patient's family discussed with case management to try to get this set up. 2. Healthcare associated  pneumonia treated no change we will continue to monitor. 3. Chronic heart failure with diastolic dysfunction continue with supportive care 4. Dementia no change continue with present management   I have personally seen and evaluated the patient, evaluated laboratory and imaging results, formulated the assessment and plan and placed orders. The Patient requires high complexity decision making with multiple systems involvement.  Rounds were done with the Respiratory Therapy Director and Staff therapists and discussed with nursing staff also.  Yevonne Pax, MD [facility] Pulmonary Critical Care Medicine Sleep Medicine

## 2020-02-20 DIAGNOSIS — I5032 Chronic diastolic (congestive) heart failure: Secondary | ICD-10-CM | POA: Diagnosis not present

## 2020-02-20 DIAGNOSIS — F0390 Unspecified dementia without behavioral disturbance: Secondary | ICD-10-CM | POA: Diagnosis not present

## 2020-02-20 DIAGNOSIS — J9621 Acute and chronic respiratory failure with hypoxia: Secondary | ICD-10-CM | POA: Diagnosis not present

## 2020-02-20 DIAGNOSIS — N179 Acute kidney failure, unspecified: Secondary | ICD-10-CM | POA: Diagnosis not present

## 2020-02-20 LAB — CBC
HCT: 30.2 % — ABNORMAL LOW (ref 36.0–46.0)
Hemoglobin: 8.8 g/dL — ABNORMAL LOW (ref 12.0–15.0)
MCH: 30.7 pg (ref 26.0–34.0)
MCHC: 29.1 g/dL — ABNORMAL LOW (ref 30.0–36.0)
MCV: 105.2 fL — ABNORMAL HIGH (ref 80.0–100.0)
Platelets: 168 10*3/uL (ref 150–400)
RBC: 2.87 MIL/uL — ABNORMAL LOW (ref 3.87–5.11)
RDW: 16.2 % — ABNORMAL HIGH (ref 11.5–15.5)
WBC: 8.8 10*3/uL (ref 4.0–10.5)
nRBC: 0 % (ref 0.0–0.2)

## 2020-02-20 LAB — BASIC METABOLIC PANEL
Anion gap: 11 (ref 5–15)
BUN: 42 mg/dL — ABNORMAL HIGH (ref 8–23)
CO2: 23 mmol/L (ref 22–32)
Calcium: 8.3 mg/dL — ABNORMAL LOW (ref 8.9–10.3)
Chloride: 106 mmol/L (ref 98–111)
Creatinine, Ser: 1.87 mg/dL — ABNORMAL HIGH (ref 0.44–1.00)
GFR calc Af Amer: 27 mL/min — ABNORMAL LOW (ref >60)
GFR calc non Af Amer: 23 mL/min — ABNORMAL LOW (ref >60)
Glucose, Bld: 145 mg/dL — ABNORMAL HIGH (ref 70–99)
Potassium: 4 mmol/L (ref 3.5–5.1)
Sodium: 140 mmol/L (ref 135–145)

## 2020-02-20 LAB — BLOOD GAS, ARTERIAL
Acid-base deficit: 1.7 mmol/L (ref 0.0–2.0)
Bicarbonate: 22.3 mmol/L (ref 20.0–28.0)
FIO2: 100
O2 Saturation: 80.6 %
Patient temperature: 36
pCO2 arterial: 34.1 mmHg (ref 32.0–48.0)
pH, Arterial: 7.425 (ref 7.350–7.450)
pO2, Arterial: 44.1 mmHg — ABNORMAL LOW (ref 83.0–108.0)

## 2020-02-20 LAB — MAGNESIUM: Magnesium: 2.1 mg/dL (ref 1.7–2.4)

## 2020-02-20 NOTE — Progress Notes (Addendum)
Pulmonary Critical Care Medicine [facility] [place]   PULMONARY CRITICAL CARE SERVICE  PROGRESS NOTE  Date of Service: 02/20/2020  Rebecca Cardenas  KCL:[id number]  DOB: 11/09/1924   DOA: 02/15/2020  Referring Physician: Carron Curie, MD  HPI: Rebecca Cardenas is a 86 y.o. female seen for follow up of Acute on Chronic Respiratory Failure.  Patient is now been on BiPAP for 3 days at 20/10 with an FiO2 of 100% remains DNR at this time and family discussion about comfort care is ongoing.  Medications: Reviewed on Rounds  Physical Exam:  Vitals: Pulse 94 respirations 34 BP 122/59 O2 sat 88% temp 96.0  Ventilator Settings currently on BiPAP 20/10 with an FiO2 of 100%  . General: Comfortable at this time . Eyes: Grossly normal lids, irises & conjunctiva . ENT: grossly tongue is normal . Neck: no obvious mass . Cardiovascular: S1 S2 normal no gallop . Respiratory: Coarse breath sounds . Abdomen: soft . Skin: no rash seen on limited exam . Musculoskeletal: not rigid . Psychiatric:unable to assess . Neurologic: no seizure no involuntary movements         Lab Data:   Basic Metabolic Panel: Recent Labs  Lab 02/14/20 1014 02/14/20 1014 02/15/20 0820 02/15/20 0820 02/17/20 0503 02/18/20 0817 02/18/20 2203 02/19/20 0603 02/20/20 0709  NA 139   < > 142  --  139 138  --  141 140  K 4.7   < > 4.6   < > 5.0 5.9* 5.4* 4.8 4.0  CL 105   < > 107  --  105 105  --  104 106  CO2 31   < > 27  --  25 24  --  27 23  GLUCOSE 195*   < > 108*  --  227* 229*  --  164* 145*  BUN 29*   < > 21  --  31* 40*  --  43* 42*  CREATININE 1.51*   < > 1.30*  --  1.76* 2.02*  --  1.97* 1.87*  CALCIUM 8.5*   < > 8.5*  --  8.7* 8.6*  --  8.6* 8.3*  MG 2.0  --  1.8  --  2.1 2.1  --   --  2.1  PHOS 4.1  --  4.1  --  4.5 4.4  --  4.9*  --    < > = values in this interval not displayed.    ABG: Recent Labs  Lab 02/18/20 1335 02/20/20 0747  PHART 7.393 7.425  PCO2ART 43.4 34.1  PO2ART 57.8* 44.1*   HCO3 25.9 22.3  O2SAT 87.2 80.6    Liver Function Tests: Recent Labs  Lab 02/14/20 1014 02/17/20 0503 02/18/20 0817 02/19/20 0603  AST 12*  --   --   --   ALT 11  --   --   --   ALKPHOS 70  --   --   --   BILITOT 0.3  --   --   --   PROT 5.6*  --   --   --   ALBUMIN 2.2* 2.2* 2.3* 2.2*   No results for input(s): LIPASE, AMYLASE in the last 168 hours. No results for input(s): AMMONIA in the last 168 hours.  CBC: Recent Labs  Lab 02/14/20 1014 02/15/20 0820 02/17/20 0503 02/18/20 0817 02/20/20 0709  WBC 6.8 7.8 9.0 11.3* 8.8  NEUTROABS 3.9  --   --   --   --   HGB 8.5* 8.8* 8.9* 9.3* 8.8*  HCT 28.0* 29.4* 29.5* 29.9* 30.2*  MCV 102.9* 103.2* 103.5* 103.5* 105.2*  PLT 230 231 210 185 168    Cardiac Enzymes: No results for input(s): CKTOTAL, CKMB, CKMBINDEX, TROPONINI in the last 168 hours.  BNP (last 3 results) No results for input(s): BNP in the last 8760 hours.  ProBNP (last 3 results) No results for input(s): PROBNP in the last 8760 hours.  Radiological Exams: US RENAL  Result Date: 02/19/2020 CLINICAL DATA:  10 year old with acute kidney injury. EXAM: RENAL / URINARY TRACT ULTRASOUND COMPLETE COMPARISON:  None. FINDINGS: Right Kidney: Renal measurements: 8.5 x 4.2 x 4.4 cm = volume: 82 mL. Prominent renal parenchymal thinning with borderline increased echogenicity. No hydronephrosis. No evidence of stone or focal lesion. Left Kidney: Renal measurements: 9.6 x 4.9 x 4.6 cm = volume: 115 mL. Mild thinning of the renal parenchyma and borderline increased echogenicity. No hydronephrosis or evidence of focal lesion. No visualized renal stone. Bladder: Not visualized by ultrasound. Other: Right pleural effusion incidentally noted. IMPRESSION: 1. Bilateral renal parenchymal thinning with increased borderline renal echogenicity consistent chronic medical renal disease. No hydronephrosis or obstructive uropathy. 2. Urinary bladder not visualized, may be decompressed. 3.  Right pleural effusion. Electronically Signed   By: Narda Rutherford M.D.   On: 02/19/2020 18:11    Assessment/Plan Active Problems:   Acute on chronic respiratory failure with hypoxia (HCC)   Healthcare-associated pneumonia   Chronic congestive heart failure with left ventricular diastolic dysfunction (HCC)   Dementia (HCC)   1. Acute on chronic respiratory failure with hypoxia patient continues on BiPAP prognosis is poor.  Family continues to discuss goals of care with case management.  She has not been able to come off of the BiPAP.  Patient is not likely to survive 2. Healthcare associated pneumonia no change we will continue to follow along.  Chest x-ray still showing significant changes 3. Chronic heart failure monitoring status 4. Dementia poor prognosis   I have personally seen and evaluated the patient, evaluated laboratory and imaging results, formulated the assessment and plan and placed orders. The Patient requires high complexity decision making with multiple systems involvement.  Rounds were done with the Respiratory Therapy Director and Staff therapists and discussed with nursing staff also.  Rebecca Pax, MD [facility] Pulmonary Critical Care Medicine Sleep Medicine

## 2020-02-21 ENCOUNTER — Encounter: Payer: Self-pay | Admitting: Internal Medicine

## 2020-02-21 DIAGNOSIS — J189 Pneumonia, unspecified organism: Secondary | ICD-10-CM | POA: Diagnosis present

## 2020-02-21 DIAGNOSIS — F0390 Unspecified dementia without behavioral disturbance: Secondary | ICD-10-CM | POA: Diagnosis present

## 2020-02-21 DIAGNOSIS — J9621 Acute and chronic respiratory failure with hypoxia: Secondary | ICD-10-CM | POA: Diagnosis present

## 2020-02-21 DIAGNOSIS — I5032 Chronic diastolic (congestive) heart failure: Secondary | ICD-10-CM | POA: Diagnosis present

## 2020-03-09 DEATH — deceased

## 2020-11-28 IMAGING — DX DG CHEST 1V PORT
1 series · 1 of 1 positions shown · non-contrast
Comparison: None.

CLINICAL DATA: Pneumonia.

EXAM:
PORTABLE CHEST 1 VIEW

[chest]
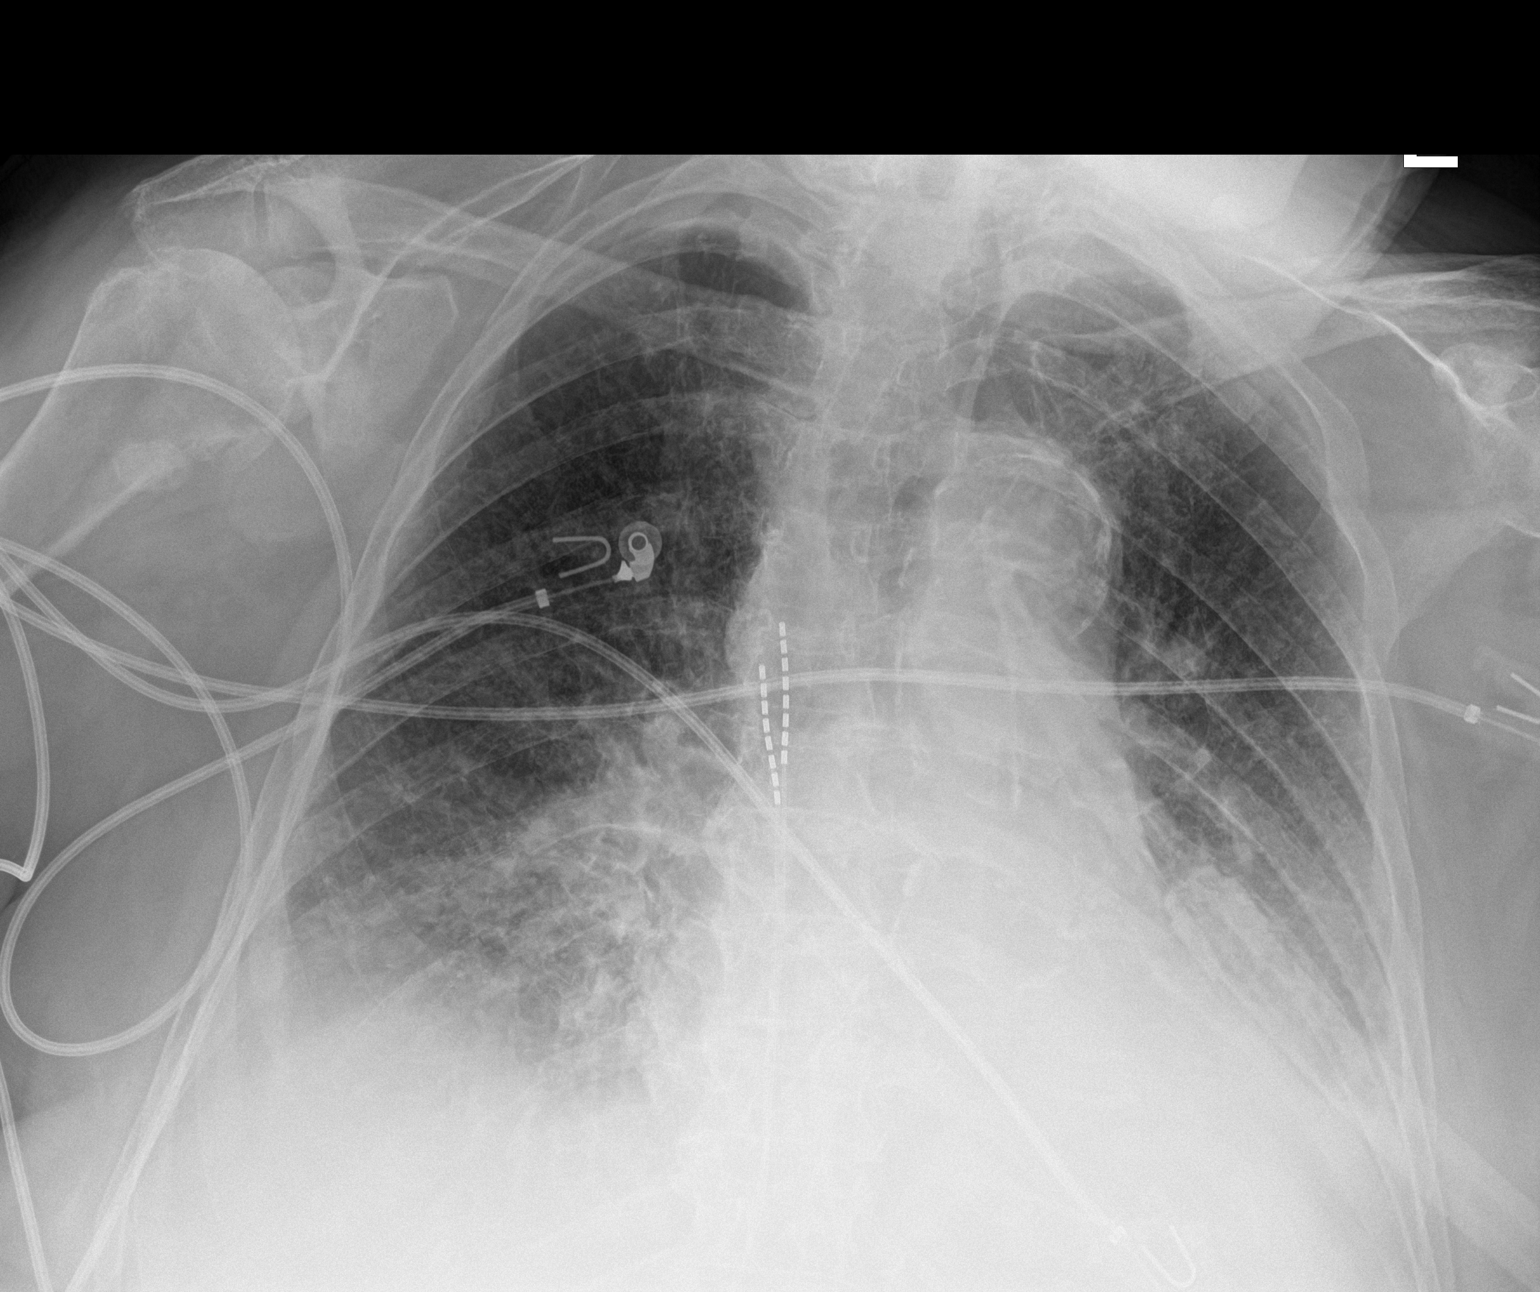

[1 of 1 positions shown; findings below may reference images not displayed]

FINDINGS: 2312 hours. Patient is rotated to the left. Diffuse interstitial and
basilar airspace disease noted. More confluent
collapse/consolidative opacity noted retrocardiac left base.
Probable small bilateral pleural effusions. Bones are diffusely
demineralized. Thoracic spinal stimulator device noted. Telemetry
leads overlie the chest.
IMPRESSION: Rotated film with cardiomegaly and bibasilar collapse/consolidation
with small bilateral pleural effusions.

## 2020-12-03 IMAGING — DX DG CHEST 1V PORT
1 series · 2 of 2 positions shown · non-contrast
Comparison: 02/17/2020

CLINICAL DATA: Respiratory distress

EXAM:
PORTABLE CHEST 1 VIEW

[Series 1: chest · 0.14mm/px · 2 of 2 slices shown]
[im 1/2]
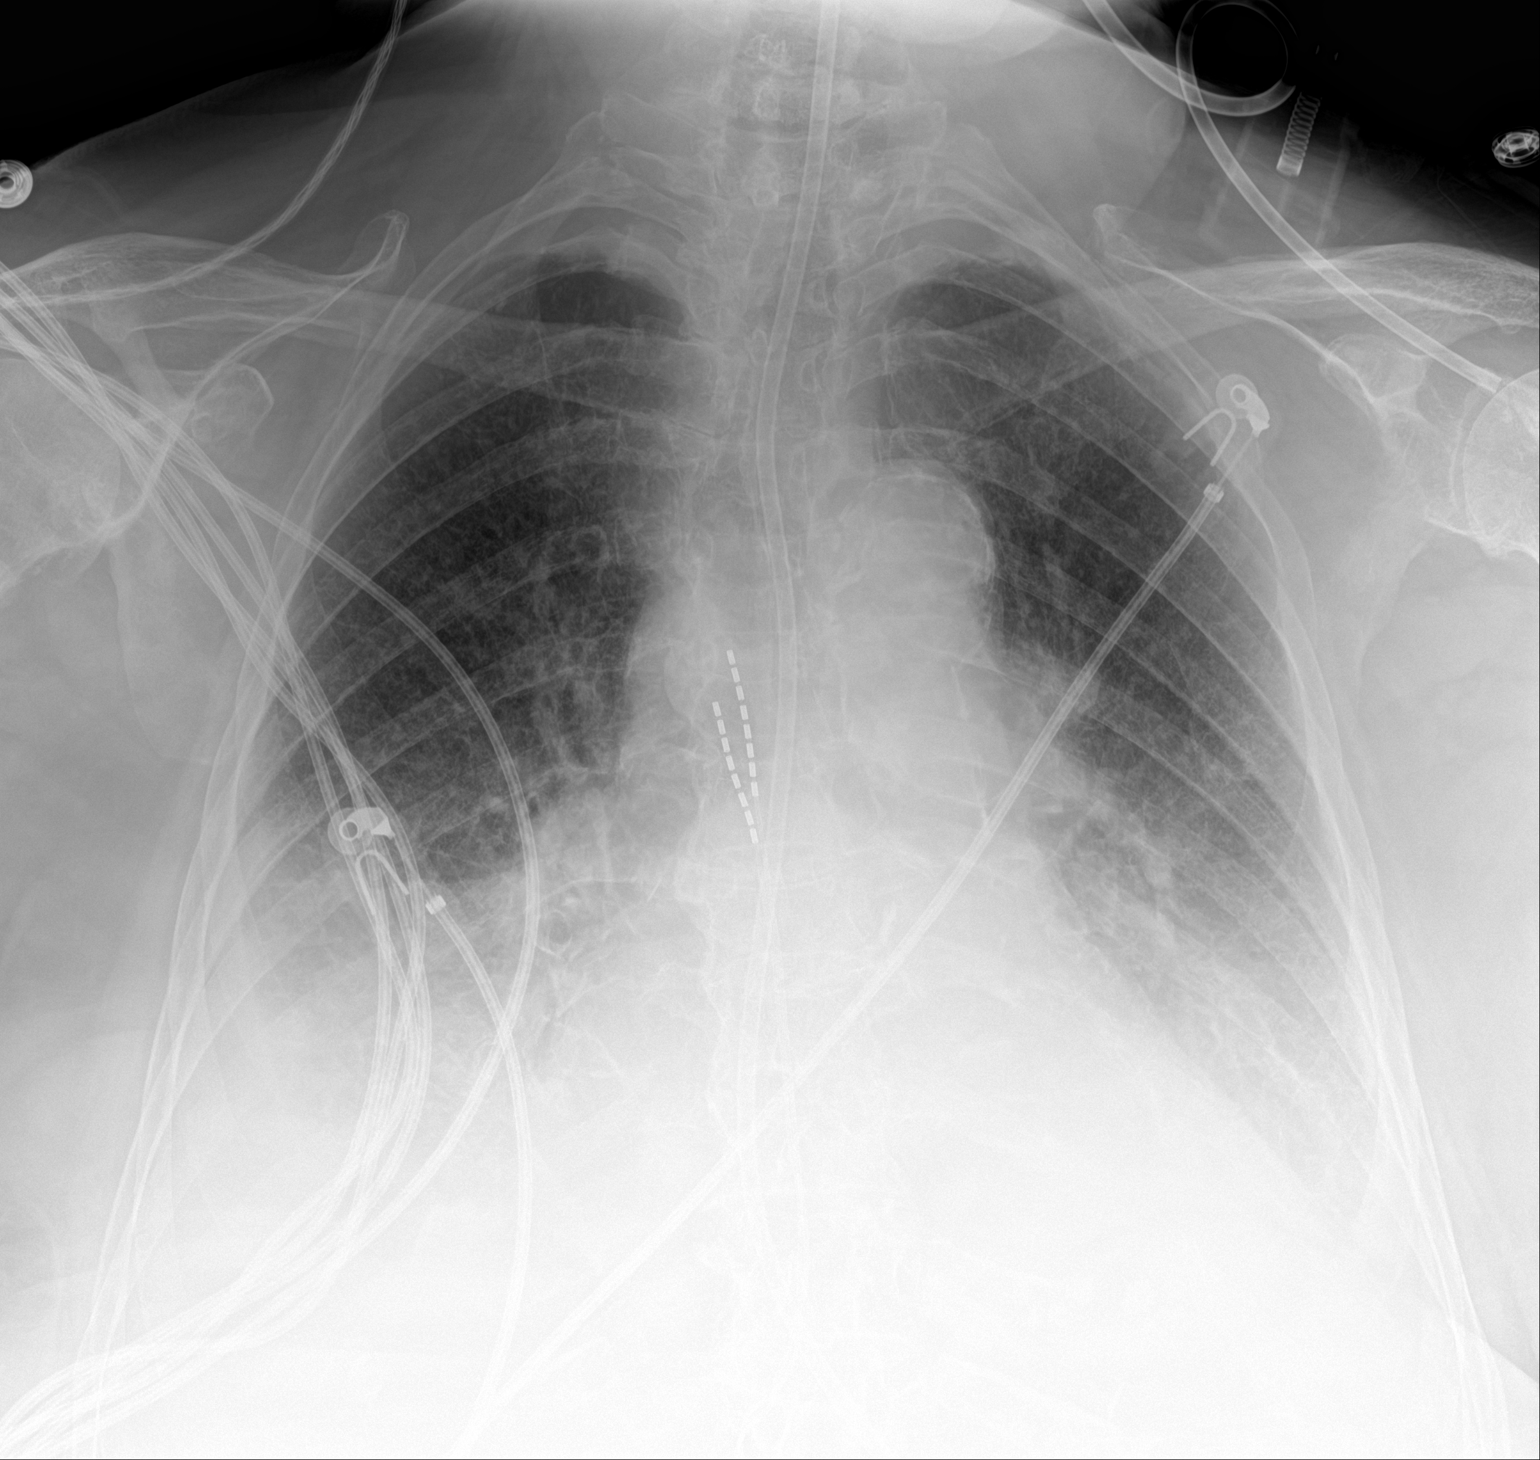
[im 2/2]
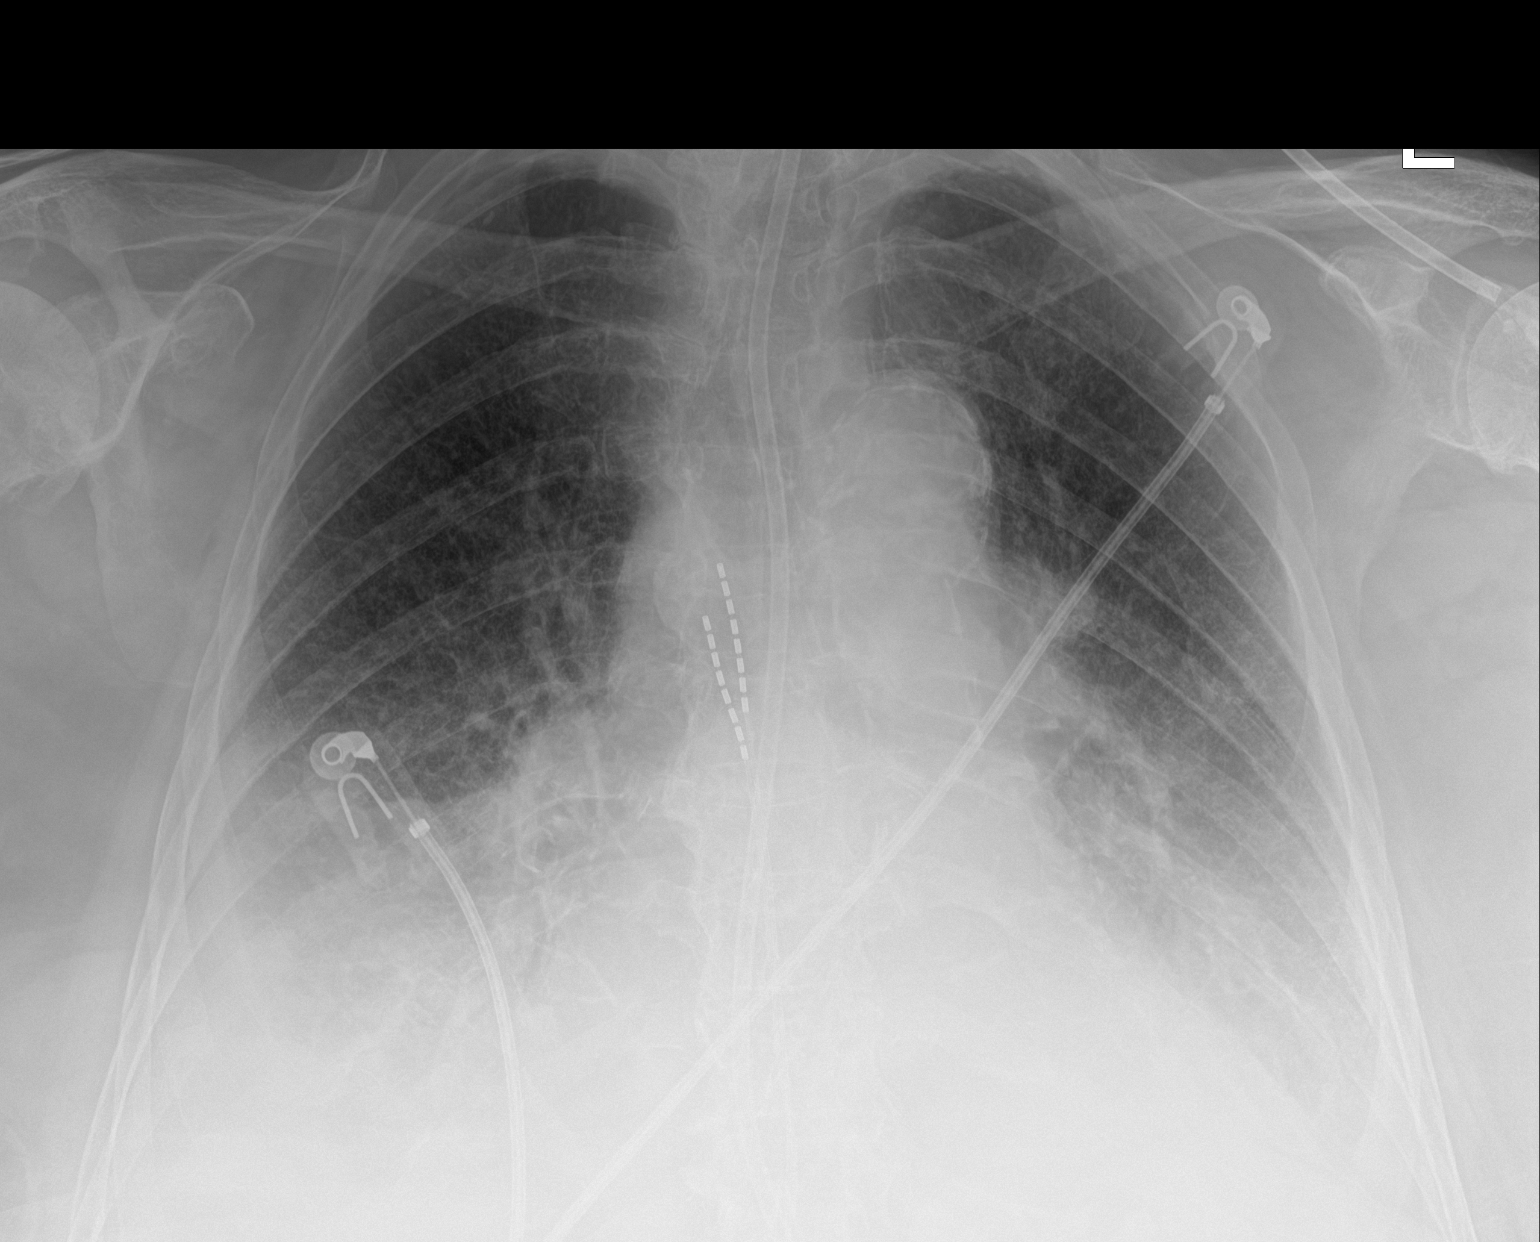

[2 of 2 positions shown; findings below may reference images not displayed]

FINDINGS: Two frontal views of the chest demonstrate stable enteric catheter
and spinal stimulator. Cardiac silhouette is enlarged. Persistent
bilateral veiling opacities unchanged. No pneumothorax. No acute
bony abnormalities.
IMPRESSION: 1. Stable bibasilar consolidation and effusions.
2. Stable cardiomegaly.

## 2020-12-04 IMAGING — US US RENAL
1 series · 14 of 25 positions shown · non-contrast
Comparison: None.

CLINICAL DATA: [AGE] with acute kidney injury.

EXAM:
RENAL / URINARY TRACT ULTRASOUND COMPLETE

[Series 1: us renal · 14 of 27 slices shown]
[im 1/27]
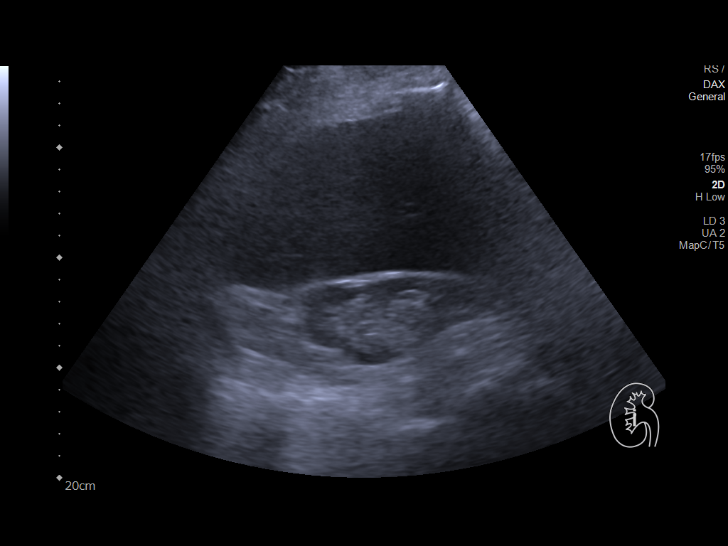
[im 3/27]
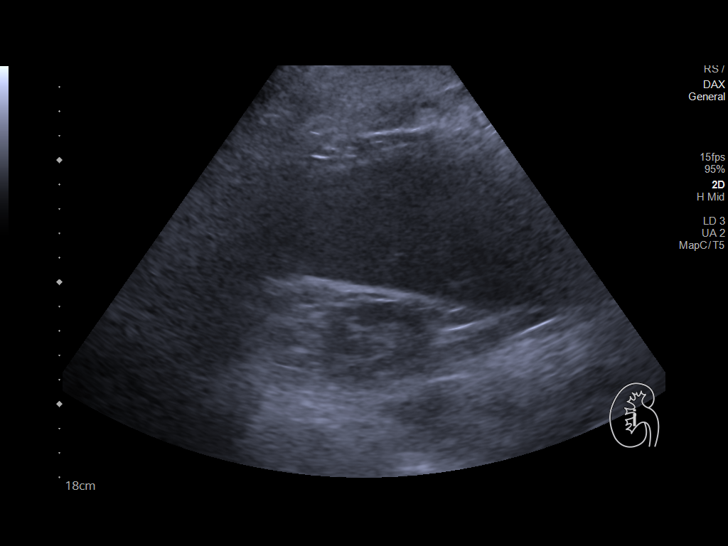
[im 5/27]
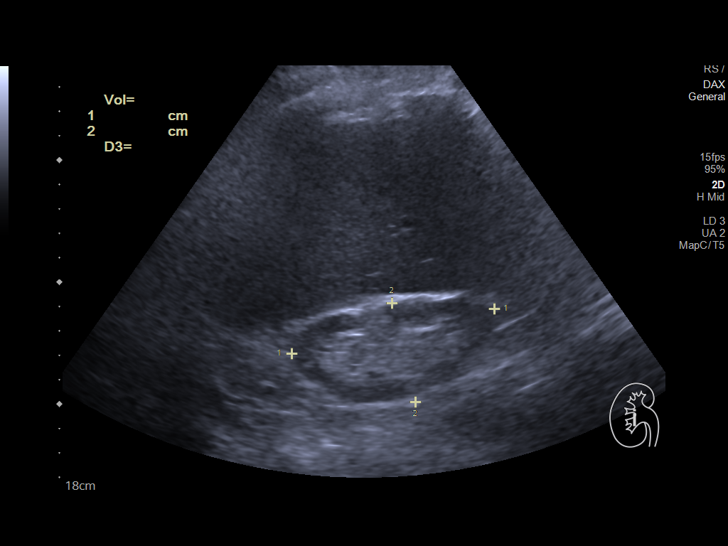
[im 7/27]
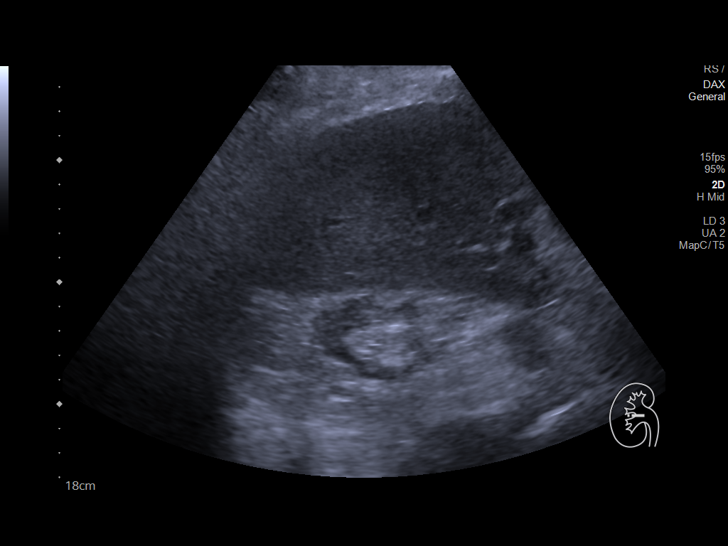
[im 9/27]
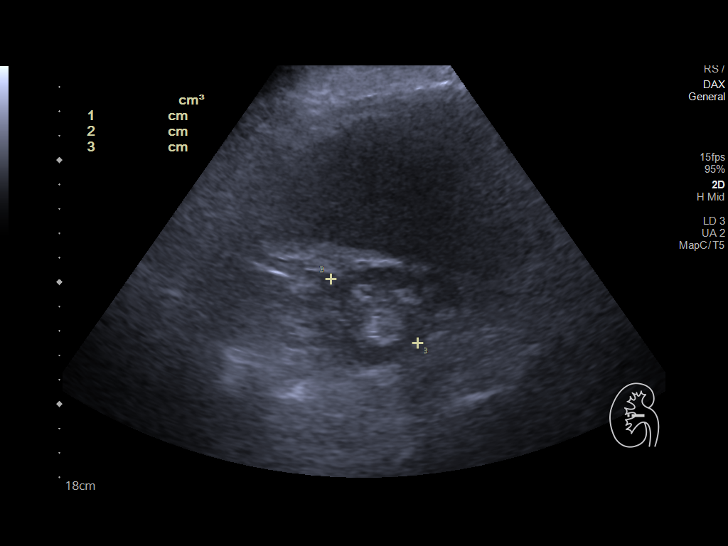
[im 10/27]
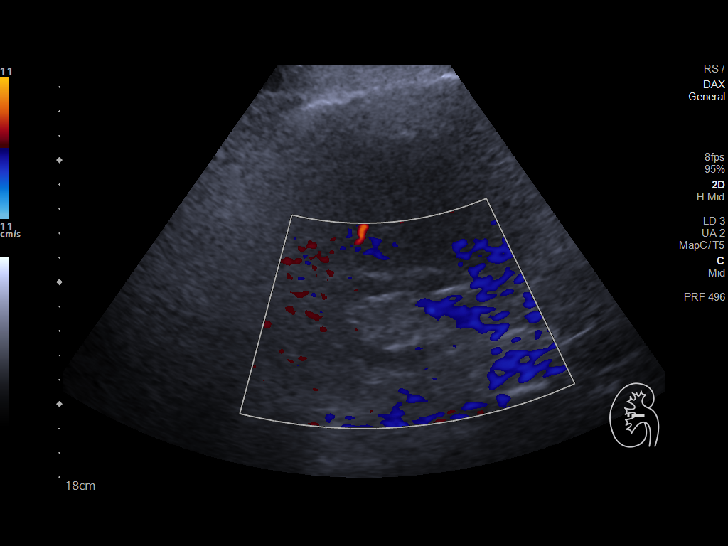
[im 12/27]
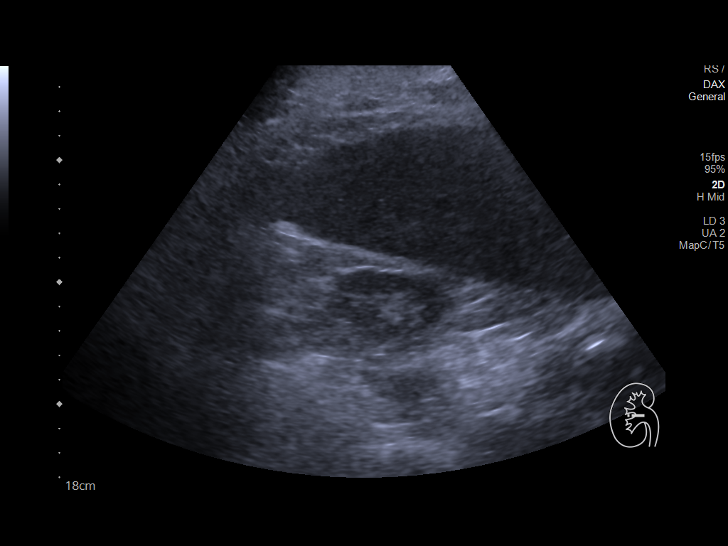
[im 15/27]
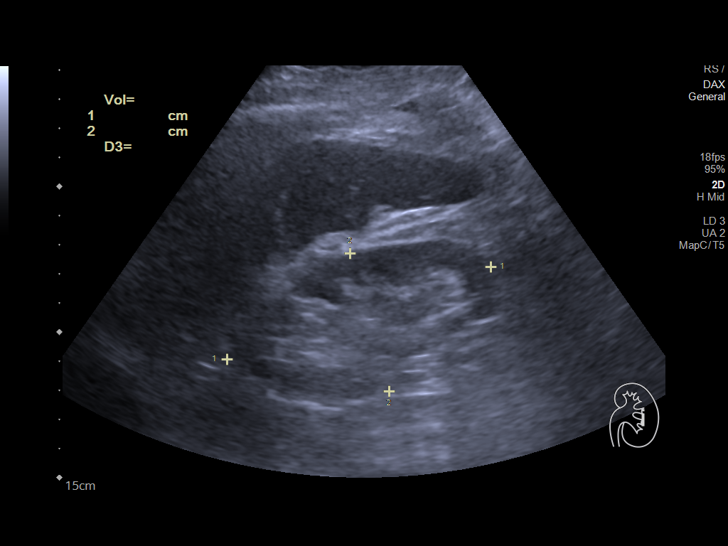
[im 17/27]
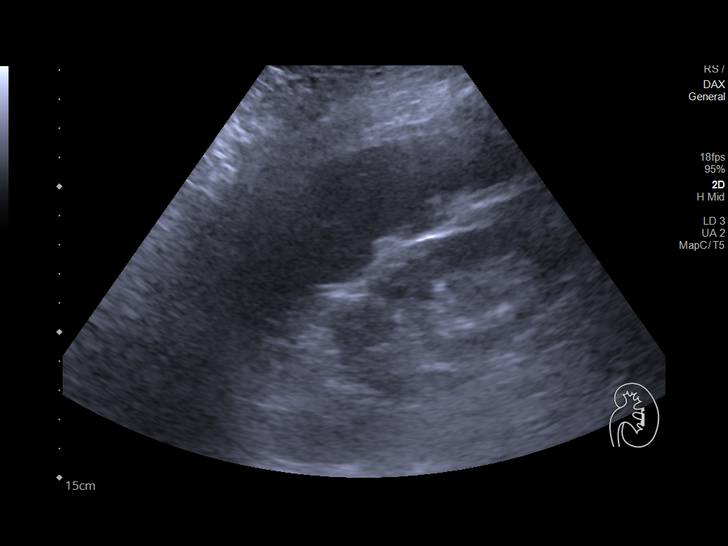
[im 18/27]
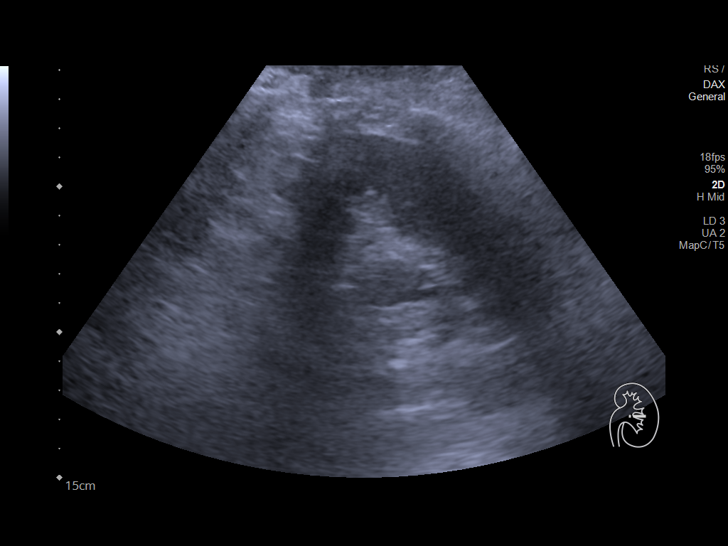
[im 20/27]
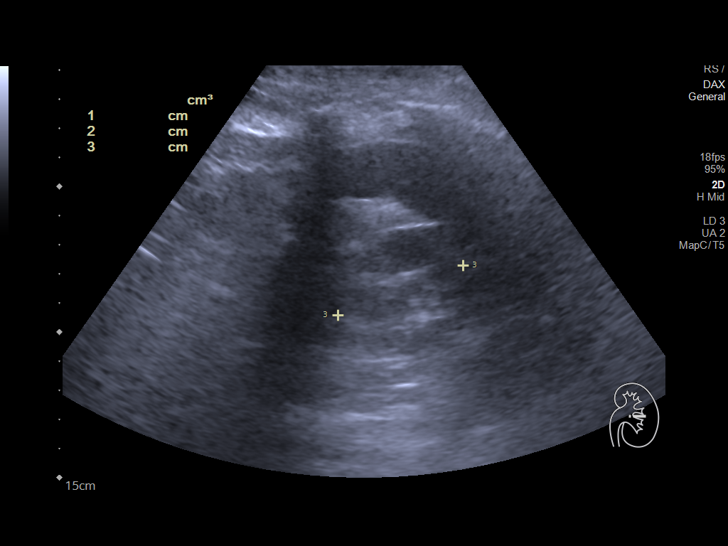
[im 22/27]
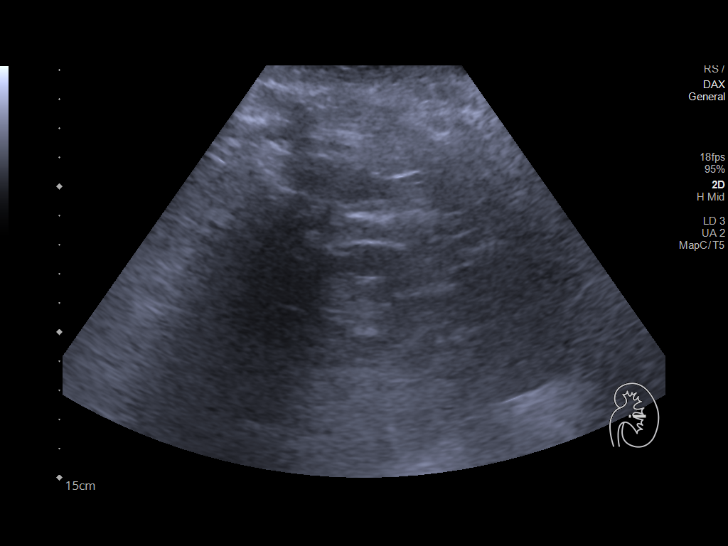
[im 24/27]
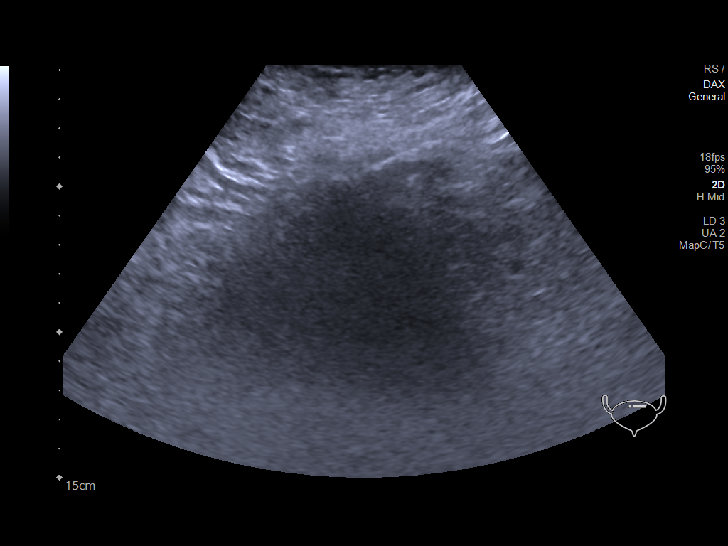
[im 27/27]
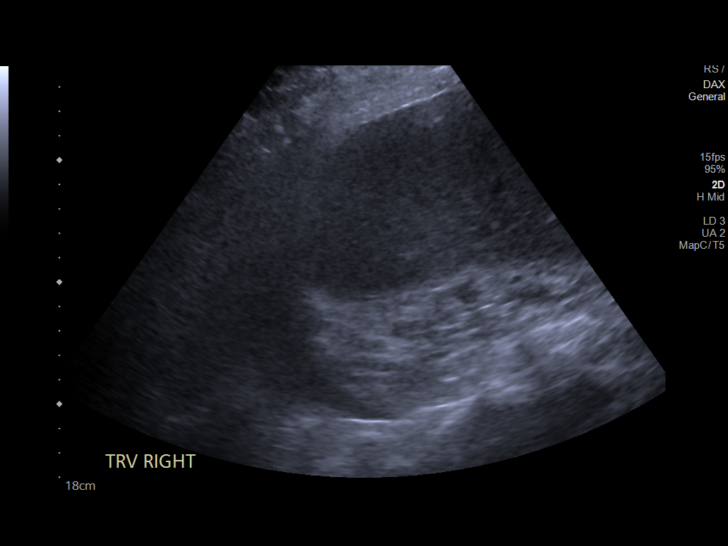

[14 of 25 positions shown; findings below may reference images not displayed]

FINDINGS: Right Kidney:

Renal measurements: 8.5 x 4.2 x 4.4 cm = volume: 82 mL. Prominent
renal parenchymal thinning with borderline increased echogenicity.
No hydronephrosis. No evidence of stone or focal lesion.

Left Kidney:

Renal measurements: 9.6 x 4.9 x 4.6 cm = volume: 115 mL. Mild
thinning of the renal parenchyma and borderline increased
echogenicity. No hydronephrosis or evidence of focal lesion. No
visualized renal stone.

Bladder:

Not visualized by ultrasound.

Other:

Right pleural effusion incidentally noted.
IMPRESSION: 1. Bilateral renal parenchymal thinning with increased borderline
renal echogenicity consistent chronic medical renal disease. No
hydronephrosis or obstructive uropathy.
2. Urinary bladder not visualized, may be decompressed.
3. Right pleural effusion.
